# Patient Record
Sex: Male | Born: 1995 | Race: White | Hispanic: No | Marital: Single | Smoking: Never smoker
Health system: Southern US, Community
[De-identification: ages and names within clinical notes are randomized; demographics above are authoritative.]

## PROBLEM LIST (undated history)

## (undated) DIAGNOSIS — F32A Depression, unspecified: Secondary | ICD-10-CM

## (undated) DIAGNOSIS — F329 Major depressive disorder, single episode, unspecified: Secondary | ICD-10-CM

## (undated) DIAGNOSIS — F988 Other specified behavioral and emotional disorders with onset usually occurring in childhood and adolescence: Secondary | ICD-10-CM

## (undated) DIAGNOSIS — F909 Attention-deficit hyperactivity disorder, unspecified type: Secondary | ICD-10-CM

## (undated) DIAGNOSIS — F419 Anxiety disorder, unspecified: Secondary | ICD-10-CM

## (undated) HISTORY — DX: Depression, unspecified: F32.A

## (undated) HISTORY — DX: Attention-deficit hyperactivity disorder, unspecified type: F90.9

## (undated) HISTORY — DX: Major depressive disorder, single episode, unspecified: F32.9

## (undated) HISTORY — DX: Other specified behavioral and emotional disorders with onset usually occurring in childhood and adolescence: F98.8

## (undated) HISTORY — DX: Anxiety disorder, unspecified: F41.9

---
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
## 2017-02-23 ENCOUNTER — Ambulatory Visit (INDEPENDENT_AMBULATORY_CARE_PROVIDER_SITE_OTHER): Payer: Self-pay

## 2017-02-23 ENCOUNTER — Ambulatory Visit (HOSPITAL_COMMUNITY)
Admission: EM | Admit: 2017-02-23 | Discharge: 2017-02-23 | Disposition: A | Discharge status: Home / Self Care | Payer: Self-pay | Attending: Family Medicine | Admitting: Family Medicine

## 2017-02-23 ENCOUNTER — Encounter (HOSPITAL_COMMUNITY): Payer: Self-pay | Admitting: Emergency Medicine

## 2017-02-23 DIAGNOSIS — R079 Chest pain, unspecified: Secondary | ICD-10-CM

## 2017-02-23 NOTE — ED Provider Notes (Signed)
  [facility]   [id number] 02/23/17 Arrival Time: 1452  ASSESSMENT & PLAN:    1. Chest pain, unspecified type    Remains likely that his symptoms stem from underlying anxiety. Did my best to reassure. CXR without abnormality. His past workups have also been negative.  Reviewed expectations re: course of current medical issues. Questions answered. Outlined signs and symptoms indicating need for more acute intervention. Follow up here or in the Emergency Department if worsening. Patient verbalized understanding. After Visit Summary given.   SUBJECTIVE:  Melvin Andrews is a 21 y.o. male who presents with complaint of recurring "chest pain". Past several months. Has been diagnosed with panic attacks in the past but reports he experiences this chest discomfort even when he doesn't feel acute anxiety. Admits he still deals with anxiety on a regular basis. Overall worried about his health. Describes the recurring chest pain as daily but comes and goes. Mainly feels in upper left chest. Can last minutes to hours. Usually comes on acutely. No n/v reported with episodes. Does report feeling like he cannot breathe well when experiencing the discomfort. No back pain. No injury reported. No skin changes. No specific aggravating or alleviating factors reported. Has been treated with Prozac in the past for anxiety but self-disconti  Does not smoke. No illicit drug use reported.  ROS: As per HPI.   OBJECTIVE:  Vitals:   02/23/17 1513  BP: (!) 109/58  Pulse: 84  Resp: 16  Temp: 98.5 F (36.9 C)  TempSrc: Oral  SpO2: 100%     General appearance: alert, cooperative, appears stated age and no distress; does appear anxious Head: Normocephalic, without obvious abnormality, atraumatic Eyes: conjunctivae/corneas clear. PERRL, EOM's intact. Ears: normal TM's and external ear canals both ears Nose: Nares normal. Mucosa normal. No drainage or sinus tenderness. Throat: lips, mucosa, and  tongue normal; teeth and gums normal Neck: no adenopathy and supple, symmetrical, trachea midline Back: symmetric, no curvature. ROM normal. No CVA tenderness. Lungs: clear to auscultation bilaterally Chest wall: no tenderness Heart: regular rate and rhythm Abdomen: soft, non-tender; bowel sounds normal; no masses,  no organomegaly; no guarding or rebound tenderness Extremities: extremities normal, atraumatic, no cyanosis or edema Skin: Skin color, texture, turgor normal. No rashes or lesions Lymph nodes: no lymphadenopathy Neurologic: Alert and oriented X 3, normal strength and tone. Normal symmetric reflexes. Normal gait.  No Known Allergies  PMHx, SurgHx, SocialHx, Medications, and Allergies were reviewed in the Visit Navigator and updated as appropriate.       Mardella LaymanHagler, Analissa Bayless, MD 02/24/17 1001

## 2017-02-23 NOTE — ED Triage Notes (Addendum)
Patient is in the process of getting medicaid.    Patient has a history of chest pain and arm numbness.  Initial episode was 4 months ago and patient refers to event as a panic attack.  Since then says he has not felt normal.  .  Patient reports tightness" in chest and feeling sob, or unable to breathe through nose.  ?patient has had right side of neck pain for a year

## 2017-12-15 ENCOUNTER — Encounter: Payer: Self-pay | Admitting: Family Medicine

## 2017-12-15 ENCOUNTER — Other Ambulatory Visit: Payer: Self-pay

## 2017-12-15 ENCOUNTER — Ambulatory Visit (INDEPENDENT_AMBULATORY_CARE_PROVIDER_SITE_OTHER): Payer: BLUE CROSS/BLUE SHIELD | Admitting: Family Medicine

## 2017-12-15 VITALS — BP 120/60 | HR 131 | Temp 99.6°F | Resp 16 | Ht 70.0 in | Wt 137.4 lb

## 2017-12-15 DIAGNOSIS — J020 Streptococcal pharyngitis: Secondary | ICD-10-CM

## 2017-12-15 DIAGNOSIS — R0789 Other chest pain: Secondary | ICD-10-CM | POA: Diagnosis not present

## 2017-12-15 DIAGNOSIS — Z113 Encounter for screening for infections with a predominantly sexual mode of transmission: Secondary | ICD-10-CM | POA: Diagnosis not present

## 2017-12-15 LAB — POCT RAPID STREP A (OFFICE): Rapid Strep A Screen: NEGATIVE

## 2017-12-15 NOTE — Patient Instructions (Signed)
     IF you received an x-ray today, you will receive an invoice from [facility]. Please contact [facility] at [phone] with questions or concerns regarding your invoice.   IF you received labwork today, you will receive an invoice from [facility]. Please contact [facility] at [phone] with questions or concerns regarding your invoice.   Our billing staff will not be able to assist you with questions regarding bills from these companies.  You will be contacted with the lab results as soon as they are available. The fastest way to get your results is to activate your My Chart account. Instructions are located on the last page of this paperwork. If you have not heard from us regarding the results in 2 weeks, please contact this office.     
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
## 2017-12-16 LAB — CBC
HEMATOCRIT: 43.1 % (ref 37.5–51.0)
Hemoglobin: 14.6 g/dL (ref 13.0–17.7)
MCH: 29.4 pg (ref 26.6–33.0)
MCHC: 33.9 g/dL (ref 31.5–35.7)
MCV: 87 fL (ref 79–97)
Platelets: 314 10*3/uL (ref 150–379)
RBC: 4.96 x10E6/uL (ref 4.14–5.80)
RDW: 12.8 % (ref 12.3–15.4)
WBC: 16.5 10*3/uL — AB (ref 3.4–10.8)

## 2017-12-16 LAB — GC/CHLAMYDIA PROBE AMP
CHLAMYDIA, DNA PROBE: NEGATIVE
NEISSERIA GONORRHOEAE BY PCR: NEGATIVE

## 2017-12-16 LAB — RPR: RPR: NONREACTIVE

## 2017-12-16 LAB — HIV ANTIBODY (ROUTINE TESTING W REFLEX): HIV SCREEN 4TH GENERATION: NONREACTIVE

## 2017-12-16 LAB — HEPATITIS B SURFACE ANTIGEN: Hepatitis B Surface Ag: NEGATIVE

## 2017-12-19 ENCOUNTER — Telehealth: Payer: Self-pay | Admitting: Family Medicine

## 2017-12-19 NOTE — Telephone Encounter (Signed)
Phone call to patient, unable to reach. If patient calls back, please relay the following:  You will be contacted with the lab results as soon as they are available. The fastest way to get your results is to activate your My Chart account. Instructions are located on the last page of this paperwork. If you have not heard from us regarding the results in 2 weeks, please contact this office.

## 2017-12-19 NOTE — Telephone Encounter (Signed)
Copied from CRM [id number]. Topic: Quick Communication - See Telephone Encounter >> Dec 19, 2017 11:44 AM Eston Mouldavis, Naketa Daddario B wrote: CRM for notification. See Telephone encounter for: 12/19/17.  Pt is requesting test results
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
## 2018-01-12 ENCOUNTER — Ambulatory Visit: Payer: BLUE CROSS/BLUE SHIELD | Admitting: Family Medicine

## 2018-01-12 ENCOUNTER — Encounter: Payer: Self-pay | Admitting: Family Medicine

## 2018-01-12 ENCOUNTER — Other Ambulatory Visit: Payer: Self-pay

## 2018-01-12 VITALS — BP 112/52 | HR 104 | Temp 99.0°F | Resp 17 | Ht 70.0 in | Wt 141.2 lb

## 2018-01-12 DIAGNOSIS — J029 Acute pharyngitis, unspecified: Secondary | ICD-10-CM | POA: Diagnosis not present

## 2018-01-12 MED ORDER — CETIRIZINE HCL 10 MG PO TABS: 10.0000 mg | ORAL_TABLET | Freq: Every day | ORAL | 11 refills | Status: AC

## 2018-01-12 NOTE — Patient Instructions (Addendum)
     IF you received an x-ray today, you will receive an invoice from [facility]. Please contact [facility] at [phone] with questions or concerns regarding your invoice.   IF you received labwork today, you will receive an invoice from [facility]. Please contact [facility] at [phone] with questions or concerns regarding your invoice.   Our billing staff will not be able to assist you with questions regarding bills from these companies.  You will be contacted with the lab results as soon as they are available. The fastest way to get your results is to activate your My Chart account. Instructions are located on the last page of this paperwork. If you have not heard from us regarding the results in 2 weeks, please contact this office.     Sore Throat A sore throat is pain, burning, irritation, or scratchiness in the throat. When you have a sore throat, you may feel pain or tenderness in your throat when you swallow or talk. Many things can cause a sore throat, including:  An infection.  Seasonal allergies.  Dryness in the air.  Irritants, such as smoke or pollution.  Gastroesophageal reflux disease (GERD).  A tumor.  A sore throat is often the first sign of another sickness. It may happen with other symptoms, such as coughing, sneezing, fever, and swollen neck glands. Most sore throats go away without medical treatment. Follow these instructions at home:  Take over-the-counter medicines only as told by your health care provider.  Drink enough fluids to keep your urine clear or pale yellow.  Rest as needed.  To help with pain, try: ? Sipping warm liquids, such as broth, herbal tea, or warm water. ? Eating or drinking cold or frozen liquids, such as frozen ice pops. ? Gargling with a salt-water mixture 3-4 times a day or as needed. To make a salt-water mixture, completely dissolve -1 tsp of salt in 1 cup of warm water. ? Sucking on hard candy or throat  lozenges. ? Putting a cool-mist humidifier in your bedroom at night to moisten the air. ? Sitting in the bathroom with the door closed for 5-10 minutes while you run hot water in the shower.  Do not use any tobacco products, such as cigarettes, chewing tobacco, and e-cigarettes. If you need help quitting, ask your health care provider. Contact a health care provider if:  You have a fever for more than 2-3 days.  You have symptoms that last (are persistent) for more than 2-3 days.  Your throat does not get better within 7 days.  You have a fever and your symptoms suddenly get worse. Get help right away if:  You have difficulty breathing.  You cannot swallow fluids, soft foods, or your saliva.  You have increased swelling in your throat or neck.  You have persistent nausea and vomiting. This information is not intended to replace advice given to you by your health care provider. Make sure you discuss any questions you have with your health care provider. Document Released: 09/30/2004 Document Revised: 04/18/2016 Document Reviewed: 06/13/2015 Elsevier Interactive Patient Education  2018 [employer]  

## 2018-01-13 LAB — GC/CHLAMYDIA PROBE AMP
CHLAMYDIA, DNA PROBE: NEGATIVE
Neisseria gonorrhoeae by PCR: NEGATIVE

## 2018-01-14 NOTE — Addendum Note (Signed)
Addended by: Collie Siad A on: 01/14/2018 06:37 AM   Modules accepted: Orders

## 2018-01-14 NOTE — Progress Notes (Signed)
Addendum Pt strep negative, GC/CT negative  Though viral causes are still likely  Will send to ENT  Maybe he needs a biospy

## 2018-03-04 ENCOUNTER — Encounter: Payer: Self-pay | Admitting: Family Medicine

## 2018-03-05 ENCOUNTER — Emergency Department (HOSPITAL_COMMUNITY)
Admission: EM | Admit: 2018-03-05 | Discharge: 2018-03-05 | Disposition: A | Discharge status: Home / Self Care | Payer: BLUE CROSS/BLUE SHIELD | Attending: Emergency Medicine | Admitting: Emergency Medicine

## 2018-03-05 ENCOUNTER — Other Ambulatory Visit: Payer: Self-pay

## 2018-03-05 ENCOUNTER — Encounter (HOSPITAL_COMMUNITY): Payer: Self-pay | Admitting: *Deleted

## 2018-03-05 DIAGNOSIS — Z79899 Other long term (current) drug therapy: Secondary | ICD-10-CM | POA: Diagnosis not present

## 2018-03-05 DIAGNOSIS — F909 Attention-deficit hyperactivity disorder, unspecified type: Secondary | ICD-10-CM | POA: Diagnosis not present

## 2018-03-05 DIAGNOSIS — F419 Anxiety disorder, unspecified: Secondary | ICD-10-CM | POA: Insufficient documentation

## 2018-03-05 DIAGNOSIS — J029 Acute pharyngitis, unspecified: Secondary | ICD-10-CM | POA: Diagnosis present

## 2018-03-05 DIAGNOSIS — F329 Major depressive disorder, single episode, unspecified: Secondary | ICD-10-CM | POA: Insufficient documentation

## 2018-03-05 DIAGNOSIS — B2790 Infectious mononucleosis, unspecified without complication: Secondary | ICD-10-CM

## 2018-03-05 LAB — URINALYSIS, ROUTINE W REFLEX MICROSCOPIC
Bacteria, UA: NONE SEEN
Bilirubin Urine: NEGATIVE
GLUCOSE, UA: NEGATIVE mg/dL
Ketones, ur: 20 mg/dL — AB
LEUKOCYTES UA: NEGATIVE
Nitrite: NEGATIVE
PROTEIN: NEGATIVE mg/dL
Specific Gravity, Urine: 1.005 (ref 1.005–1.030)
pH: 6 (ref 5.0–8.0)

## 2018-03-05 LAB — CBC
HEMATOCRIT: 42.5 % (ref 39.0–52.0)
HEMOGLOBIN: 14 g/dL (ref 13.0–17.0)
MCH: 28.4 pg (ref 26.0–34.0)
MCHC: 32.9 g/dL (ref 30.0–36.0)
MCV: 86.2 fL (ref 78.0–100.0)
PLATELETS: 251 10*3/uL (ref 150–400)
RBC: 4.93 MIL/uL (ref 4.22–5.81)
RDW: 12.7 % (ref 11.5–15.5)
WBC: 14.7 10*3/uL — AB (ref 4.0–10.5)

## 2018-03-05 LAB — COMPREHENSIVE METABOLIC PANEL
ALT: 79 U/L — ABNORMAL HIGH (ref 0–44)
ANION GAP: 11 (ref 5–15)
AST: 48 U/L — ABNORMAL HIGH (ref 15–41)
Albumin: 3.9 g/dL (ref 3.5–5.0)
Alkaline Phosphatase: 137 U/L — ABNORMAL HIGH (ref 38–126)
BUN: 9 mg/dL (ref 6–20)
CO2: 25 mmol/L (ref 22–32)
Calcium: 8.9 mg/dL (ref 8.9–10.3)
Chloride: 96 mmol/L — ABNORMAL LOW (ref 98–111)
Creatinine, Ser: 1.14 mg/dL (ref 0.61–1.24)
Glucose, Bld: 87 mg/dL (ref 70–99)
POTASSIUM: 3.5 mmol/L (ref 3.5–5.1)
Sodium: 132 mmol/L — ABNORMAL LOW (ref 135–145)
Total Bilirubin: 1.3 mg/dL — ABNORMAL HIGH (ref 0.3–1.2)
Total Protein: 7.1 g/dL (ref 6.5–8.1)

## 2018-03-05 LAB — GROUP A STREP BY PCR: Group A Strep by PCR: NOT DETECTED

## 2018-03-05 LAB — MONONUCLEOSIS SCREEN: Mono Screen: POSITIVE — AB

## 2018-03-05 LAB — LIPASE, BLOOD: Lipase: 23 U/L (ref 11–51)

## 2018-03-05 LAB — I-STAT CG4 LACTIC ACID, ED: Lactic Acid, Venous: 1.24 mmol/L (ref 0.5–1.9)

## 2018-03-05 MED ORDER — ACETAMINOPHEN 325 MG PO TABS
650.0000 mg | ORAL_TABLET | Freq: Once | ORAL | Status: AC
  Administered 2018-03-05: 650 mg via ORAL
  Filled 2018-03-05: qty 2

## 2018-03-05 MED ORDER — KETOROLAC TROMETHAMINE 30 MG/ML IJ SOLN
30.0000 mg | Freq: Once | INTRAMUSCULAR | Status: AC
  Administered 2018-03-05: 30 mg via INTRAVENOUS

## 2018-03-05 MED ORDER — DEXAMETHASONE SODIUM PHOSPHATE 10 MG/ML IJ SOLN
10.0000 mg | Freq: Once | INTRAMUSCULAR | Status: AC
  Administered 2018-03-05: 10 mg via INTRAVENOUS
  Filled 2018-03-05: qty 1

## 2018-03-05 MED ORDER — SODIUM CHLORIDE 0.9 % IV BOLUS
1000.0000 mL | Freq: Once | INTRAVENOUS | Status: AC
  Administered 2018-03-05: 1000 mL via INTRAVENOUS

## 2018-03-05 MED ORDER — IBUPROFEN 600 MG PO TABS: 600.0000 mg | ORAL_TABLET | Freq: Four times a day (QID) | ORAL | 0 refills | Status: DC | PRN

## 2018-03-05 MED ORDER — KETOROLAC TROMETHAMINE 30 MG/ML IJ SOLN
30.0000 mg | Freq: Once | INTRAMUSCULAR | Status: DC
  Filled 2018-03-05: qty 1

## 2018-03-05 NOTE — ED Triage Notes (Signed)
Sore throat for 4 months with a fever for 3-4 days  She has a doctor that the pt feels is not being treated.  Last strep screen was 1-2 months ago

## 2018-03-05 NOTE — ED Provider Notes (Signed)
[facility] EMERGENCY DEPARTMENT Provider Note   CSN: [id number] Arrival date & time: 03/05/18  1657     History   Chief Complaint Chief Complaint  Patient presents with  . Sore Throat  . Fever    HPI Melvin Andrews is a 22 y.o. male.  HPI   Patient is a 22 year old male with a history of ADHD, anxiety, depression presenting for sore throat, fever, generalized malaise.  Patient reports that he has had intermittent fevers and sore throats for the past 3 to 4 months, previously evaluated by primary care and had multiple swabs of the throat taken but his condition acutely worse in the last for 5 days.  Patient reports severe pain that has prevented him from keeping anything down by mouth for the past 24 hours.  Patient reports fever at home for the past 3 days.  Patient reports taking ibuprofen at home without full relief of his symptoms.  Patient reports he has been nauseous without vomiting.  Patient also notes nonproductive cough.  Patient denies any other sick contacts.  Patient does note that he was tested for HIV in early May of 2019, which was negative.  Patient also had negative strep and GC/CT swabs of the throat.  Patient does report one sexual partner within the last 3 months, and performs oral and anal intercourse, and 2 partners within last 3 months.   Past Medical History:  Diagnosis Date  . ADD (attention deficit disorder)   . ADHD (attention deficit hyperactivity disorder)   . Anxiety   . Depression     Patient Active Problem List   Diagnosis Date Noted  . 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 Mood disorder (HCC) 12/19/2014    History reviewed. No pertinent surgical history.      Home Medications    Prior to Admission medications   Medication Sig Start Date End Date Taking? Authorizing Provider  cetirizine (ZYRTEC) 10 MG tablet Take 1 tablet (10 mg total) by mouth daily. 01/12/18   Doristine Bosworth, MD  ibuprofen (ADVIL,MOTRIN) 600 MG tablet Take 1 tablet  (600 mg total) by mouth every 6 (six) hours as needed. 03/05/18   Aviva Kluver B, PA-C  methocarbamol (ROBAXIN) 500 MG tablet Take 1 tablet (500 mg total) by mouth 2 (two) times daily. Patient not taking: Reported on 12/15/2017 08/24/15   Anselm Pancoast, PA-C    Family History No family history on file.  Social History Social History   Tobacco Use  . Smoking status: Never Smoker  . Smokeless tobacco: Current User  Substance Use Topics  . Alcohol use: Yes    Alcohol/week: 0.0 oz    Comment: reports occasional use  . Drug use: Yes    Types: Marijuana    Comment: has tried marijuana; denies Rx abuse     Allergies   Oxycodone   Review of Systems Review of Systems  Constitutional: Positive for chills and fever.  HENT: Positive for congestion, rhinorrhea, sore throat and trouble swallowing.   Respiratory: Positive for cough. Negative for wheezing.   Cardiovascular: Negative for chest pain.  Gastrointestinal: Positive for nausea. Negative for abdominal distention and vomiting.  Skin: Negative for rash.  Neurological: Positive for weakness.       +generalized weakness  All other systems reviewed and are negative.    Physical Exam Updated Vital Signs BP 113/70   Pulse 92   Temp 98.6 F (37 C) (Oral)   Resp 18   Ht 5\' 11"  (1.803  m)   Wt 63.5 kg (140 lb)   SpO2 97%   BMI 19.53 kg/m   Physical Exam  Constitutional: He appears well-developed and well-nourished. No distress.  HENT:  Head: Normocephalic and atraumatic.  Mouth/Throat: Uvula is midline and oropharynx is clear and moist. Tonsils are 3+ on the right. Tonsils are 3+ on the left.  Normal phonation. No muffled voice sounds. Patient swallows secretions without difficulty. Dentition normal. No lesions of tongue or buccal mucosa. Uvula midline. No asymmetric swelling of the posterior pharynx. Significant erythema of posterior pharynx. No tonsillar exuduate. No lingual swelling. No induration inferior to tongue. No  submandibular tenderness, swelling, or induration.  Tissues of the neck supple. Posterior cervical lymphadenopathy.  Eyes: Pupils are equal, round, and reactive to light. Conjunctivae and EOM are normal.  Neck: Normal range of motion. Neck supple.  +Posterior cervical adenopathy.  No nuchal rigidity.  No meningismus.  Cardiovascular: Normal rate, regular rhythm, S1 normal and S2 normal.  No murmur heard. Pulmonary/Chest: Effort normal and breath sounds normal. He has no wheezes. He has no rales.  Abdominal: Soft. He exhibits no distension. There is no tenderness. There is no guarding.  Musculoskeletal: Normal range of motion. He exhibits no edema or deformity.  Lymphadenopathy:    He has cervical adenopathy.  Neurological: He is alert.  Cranial nerves grossly intact. Patient moves extremities symmetrically and with good coordination.  Skin: Skin is warm and dry. No rash noted. No erythema.  No rashes of the chest, back, or extremities.  No lesions of the palms or soles.  Psychiatric: He has a normal mood and affect. His behavior is normal. Judgment and thought content normal.  Nursing note and vitals reviewed.  ED Treatments / Results  Labs (all labs ordered are listed, but only abnormal results are displayed) Labs Reviewed  COMPREHENSIVE METABOLIC PANEL - Abnormal; Notable for the following components:      Result Value   Sodium 132 (*)    Chloride 96 (*)    AST 48 (*)    ALT 79 (*)    Alkaline Phosphatase 137 (*)    Total Bilirubin 1.3 (*)    All other components within normal limits  CBC - Abnormal; Notable for the following components:   WBC 14.7 (*)    All other components within normal limits  URINALYSIS, ROUTINE W REFLEX MICROSCOPIC - Abnormal; Notable for the following components:   Hgb urine dipstick MODERATE (*)    Ketones, ur 20 (*)    All other components within normal limits  MONONUCLEOSIS SCREEN - Abnormal; Notable for the following components:   Mono Screen  POSITIVE (*)    All other components within normal limits  GROUP A STREP BY PCR  HSV CULTURE AND TYPING  LIPASE, BLOOD  I-STAT CG4 LACTIC ACID, ED  GC/CHLAMYDIA PROBE AMP ([place]) NOT AT [facility]    EKG None  Radiology No results found.  Procedures Procedures (including critical care time)  Medications Ordered in ED Medications  acetaminophen (TYLENOL) tablet 650 mg (650 mg Oral Given 03/05/18 1720)  sodium chloride 0.9 % bolus 1,000 mL (0 mLs Intravenous Stopped 03/05/18 2031)  dexamethasone (DECADRON) injection 10 mg (10 mg Intravenous Given 03/05/18 1931)  ketorolac (TORADOL) 30 MG/ML injection 30 mg (30 mg Intravenous Given 03/05/18 1939)     Initial Impression / Assessment and Plan / ED Course  I have reviewed the triage vital signs and the nursing notes.  Pertinent labs & imaging results that were available  during my care of the patient were reviewed by me and considered in my medical decision making (see chart for details).     Patient is nontoxic-appearing, febrile up to 101.7, but tolerating secretions and in no acute distress.  Patient exhibits transaminitis and elevated alkaline Foss and total bili.  Suspect that patient may have false positive hemoglobinuria in setting of acute illness, but will have this reassessed.  Leukocytosis noted to 14.7.  Mononucleosis screen is positive.  No evidence of peritonsillar abscess or deep space infection of the head and neck.  Patient deferred further testing for HIV today, however I feel that it may be prudent given that patient has had symptoms of recurrent intermittent fevers for a couple months prior to this acute illness.  Discussed RNA testing.  Patient was given precautions for preventing spread of infectious mononucleosis, as well as avoiding contact activity to prevent splenic rupture for the next 3 weeks.  Ibuprofen and Tylenol advised.  Patient was given return cautions for any difficulty breathing, difficulty swallowing,  trouble with nausea or vomiting, significant neck stiffness, pain, or severe headache.  Patient is in understanding and agrees with the plan of care.  Final Clinical Impressions(s) / ED Diagnoses   Final diagnoses:  Infectious mononucleosis without complication, infectious mononucleosis due to unspecified organism    ED Discharge Orders        Ordered    ibuprofen (ADVIL,MOTRIN) 600 MG tablet  Every 6 hours PRN     03/05/18 2238       Elisha PonderMurray, Zykera Abella B, PA-C 03/05/18 2324    Bethann BerkshireZammit, Joseph, MD 03/09/18 1011

## 2018-03-05 NOTE — Discharge Instructions (Signed)
Please see the information and instructions below regarding your visit.  Your diagnoses today include:  1. Infectious mononucleosis without complication, infectious mononucleosis due to unspecified organism     Tests performed today include: See side panel of your discharge paperwork for testing performed today. Vital signs are listed at the bottom of these instructions.   You are diagnosed with infectious mononucleosis.  This is a condition that can be caused by Epstein-Barr virus.  It has a long incubation.  As well as a long course compared to other viral syndromes.  You are actively infectious right now, and are as long as you have symptoms, however there is no data of the exact time of which she will not be infectious any longer.  Please handle your secretions by covering your mouth, avoiding sharing food, drinks, or saliva with others while you are having symptoms.  I would like you to have your liver enzymes rechecked in 2 to 3 weeks.  At the same time, I would like you to have a repeat test of your HIV PCR and HIV RNA.  I will send a note to your primary doctor regarding this.  Medications prescribed:    Take any prescribed medications only as prescribed, and any over the counter medications only as directed on the packaging.  You are prescribed ibuprofen, a non-steroidal anti-inflammatory agent (NSAID) for pain. You may take 600mg  every 6 hours as needed for pain. If still requiring this medication around the clock for acute pain after 10 days, please see your primary healthcare provider.  Women who are pregnant, breastfeeding, or planning on becoming pregnant should not take non-steroidal anti-inflammatories such as Advil and Aleve. Tylenol is a safe over the counter pain reliever in pregnant women.  You may combine this medication with Tylenol, 650 mg every 6 hours, so you are receiving something for pain every 3 hours.  This is not a long-term medication unless under the care and  direction of your primary provider. Taking this medication long-term and not under the supervision of a healthcare provider could increase the risk of stomach ulcers, kidney problems, and cardiovascular problems such as high blood pressure.    Home care instructions:  Please follow any educational materials contained in this packet.  It is very important that you avoid any kind of activity for you could be hit in the abdomen or have any, contact to the abdomen for the next 3 weeks.  Infectious mononucleosis can cause a splint as well putting her high risk of a spleen rupture.  This is very rare, but please make sure that you avoid any activity that could precipitate this.  Follow-up instructions: Please follow-up with your primary care provider in 2-3 weeks for further evaluation of your symptoms if they are not completely improved.   Return instructions:  Please return to the Emergency Department if you experience worsening symptoms.  Please return the emergency department for any worsening sore throat that prevents you from swallowing or if you have difficulty breathing, any abdominal pain, nausea, vomiting prevention keep anything down, neck stiffness severe neck pain, or severe headache. Please return if you have any other emergent concerns.  Additional Information:   Your vital signs today were: BP 113/70    Pulse 92    Temp 98.6 F (37 C) (Oral)    Resp 18    Ht 5\' 11"  (1.803 m)    Wt 63.5 kg (140 lb)    SpO2 97%    BMI 19.53 kg/m  If your blood pressure (BP) was elevated on multiple readings during this visit above 130 for the top number or above 80 for the bottom number, please have this repeated by your primary care provider within one month. --------------  Thank you for allowing us to participate in your care today.

## 2018-03-08 LAB — HSV CULTURE AND TYPING

## 2018-03-13 ENCOUNTER — Encounter: Payer: Self-pay | Admitting: Family Medicine

## 2018-03-14 NOTE — Telephone Encounter (Signed)
Patient called and states he has not received the work Physicist, medicalletter.  He states you can fax it. Please see below message for the dates. FAX# [phone].

## 2018-08-23 ENCOUNTER — Ambulatory Visit: Payer: BLUE CROSS/BLUE SHIELD | Admitting: Family Medicine

## 2018-08-27 ENCOUNTER — Encounter: Payer: Self-pay | Admitting: Family Medicine

## 2018-09-19 ENCOUNTER — Ambulatory Visit: Payer: Self-pay | Admitting: Family Medicine

## 2018-10-12 ENCOUNTER — Other Ambulatory Visit: Payer: Self-pay

## 2018-10-12 ENCOUNTER — Encounter (HOSPITAL_COMMUNITY): Payer: Self-pay

## 2018-10-12 ENCOUNTER — Emergency Department (HOSPITAL_COMMUNITY)
Admission: EM | Admit: 2018-10-12 | Discharge: 2018-10-12 | Disposition: A | Discharge status: Home / Self Care | Payer: 59 | Attending: Emergency Medicine | Admitting: Emergency Medicine

## 2018-10-12 ENCOUNTER — Emergency Department (HOSPITAL_COMMUNITY): Payer: 59

## 2018-10-12 ENCOUNTER — Ambulatory Visit: Payer: Self-pay | Admitting: *Deleted

## 2018-10-12 DIAGNOSIS — F419 Anxiety disorder, unspecified: Secondary | ICD-10-CM | POA: Diagnosis not present

## 2018-10-12 DIAGNOSIS — Z79899 Other long term (current) drug therapy: Secondary | ICD-10-CM | POA: Insufficient documentation

## 2018-10-12 DIAGNOSIS — M549 Dorsalgia, unspecified: Secondary | ICD-10-CM | POA: Insufficient documentation

## 2018-10-12 DIAGNOSIS — R0602 Shortness of breath: Secondary | ICD-10-CM | POA: Insufficient documentation

## 2018-10-12 DIAGNOSIS — R079 Chest pain, unspecified: Secondary | ICD-10-CM | POA: Diagnosis present

## 2018-10-12 DIAGNOSIS — F17220 Nicotine dependence, chewing tobacco, uncomplicated: Secondary | ICD-10-CM | POA: Diagnosis not present

## 2018-10-12 DIAGNOSIS — G8929 Other chronic pain: Secondary | ICD-10-CM | POA: Diagnosis not present

## 2018-10-12 DIAGNOSIS — R42 Dizziness and giddiness: Secondary | ICD-10-CM | POA: Diagnosis not present

## 2018-10-12 DIAGNOSIS — R5381 Other malaise: Secondary | ICD-10-CM | POA: Diagnosis not present

## 2018-10-12 LAB — BASIC METABOLIC PANEL
Anion gap: 9 (ref 5–15)
BUN: 11 mg/dL (ref 6–20)
CHLORIDE: 104 mmol/L (ref 98–111)
CO2: 26 mmol/L (ref 22–32)
CREATININE: 1.15 mg/dL (ref 0.61–1.24)
Calcium: 9.4 mg/dL (ref 8.9–10.3)
GFR calc non Af Amer: >60 mL/min (ref >60)
Glucose, Bld: 142 mg/dL — ABNORMAL HIGH (ref 70–99)
Potassium: 3.5 mmol/L (ref 3.5–5.1)
SODIUM: 139 mmol/L (ref 135–145)

## 2018-10-12 LAB — CBC WITH DIFFERENTIAL/PLATELET
ABS IMMATURE GRANULOCYTES: 0.02 10*3/uL (ref 0.00–0.07)
Basophils Absolute: 0 10*3/uL (ref 0.0–0.1)
Basophils Relative: 1 %
Eosinophils Absolute: 0.2 10*3/uL (ref 0.0–0.5)
Eosinophils Relative: 3 %
HCT: 43.7 % (ref 39.0–52.0)
HEMOGLOBIN: 14.7 g/dL (ref 13.0–17.0)
IMMATURE GRANULOCYTES: 0 %
Lymphocytes Relative: 30 %
Lymphs Abs: 2.2 10*3/uL (ref 0.7–4.0)
MCH: 29.3 pg (ref 26.0–34.0)
MCHC: 33.6 g/dL (ref 30.0–36.0)
MCV: 87.2 fL (ref 80.0–100.0)
Monocytes Absolute: 0.6 10*3/uL (ref 0.1–1.0)
Monocytes Relative: 8 %
NRBC: 0 % (ref 0.0–0.2)
Neutro Abs: 4.3 10*3/uL (ref 1.7–7.7)
Neutrophils Relative %: 58 %
Platelets: 323 10*3/uL (ref 150–400)
RBC: 5.01 MIL/uL (ref 4.22–5.81)
RDW: 12.7 % (ref 11.5–15.5)
WBC: 7.3 10*3/uL (ref 4.0–10.5)

## 2018-10-12 LAB — TROPONIN I: Troponin I: <0.03 ng/mL (ref <0.03)

## 2018-10-12 NOTE — ED Triage Notes (Signed)
Dizziness, chest pains, and shortness of breath episodes over past month. Noticeably worse recently. States he has been worked up for something similar and was told it was anxiety, but states "this feels different". A/o x 4 on exam, not in distress at this time.

## 2018-10-12 NOTE — Telephone Encounter (Signed)
Pt calling with complaints of left sided chest pain. "I've had chest issues for a long time but everybody is telling me it is anxiety". Pt states he is also having issues with trying to breath and left arm feels a little tingly. Pt concerned because he has a  family history of heart issues. Pt states that he does have left sided chest pain that is sharp at times "during the process of anxiety attacks".  "I have dealt with this for a long time as part of anxiety". Today pt states he feels very dizzy and has been dizzy for the past couple of weeks due to not feeling like he can breath properly. Pt states he has not felt normal in the past 2- 3 months. Pt states he has social anxiety but the way he feels now is different from his normal anxiety. Pt advised to seek treatment in the ED for current symptoms. Pt verbalized understanding.   Reason for Disposition . Dizziness or lightheadedness  Answer Assessment - Initial Assessment Questions 1. LOCATION: "Where does it hurt?"       Left side of chest 2. RADIATION: "Does the pain go anywhere else?" (e.g., into neck, jaw, arms, back)     In left arm tingling and sometimes in right arm 3. ONSET: "When did the chest pain begin?" (Minutes, hours or days)      Pt sates that today it has been constant 4. PATTERN "Does the pain come and go, or has it been constant since it started?"  "Does it get worse with exertion?"      constant 5. CARDIAC RISK FACTORS: "Do you have any history of heart problems or risk factors for heart disease?" (e.g., prior heart attack, angina; high blood pressure, diabetes, being overweight, high cholesterol, smoking, or strong family history of heart disease)     Family history no personal history, pt states that he has been vaping for the past 2 years      6. CAUSE: "What do you think is causing the chest pain?"     unsure 7. OTHER SYMPTOMS: "Do you have any other symptoms?" (e.g., dizziness, nausea, vomiting, sweating, fever,  difficulty breathing, cough)       Dizziness and shortness of breath  Protocols used: CHEST PAIN-A-AH

## 2018-10-12 NOTE — ED Provider Notes (Signed)
[facility] Provider Note   CSN: [id number] Arrival date & time: 10/12/18  1724     History   Chief Complaint Chief Complaint  Patient presents with  . Dizziness  . Shortness of Breath  . Chest Pain    HPI Melvin Andrews is a 23 y.o. male.  HPI   He presents for evaluation of chest pain, shortness of breath, dizziness, and back pain.  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  He denies cough, fever, nausea, vomiting, anorexia, weight loss, focal weakness or paresthesia.  He states that his back pain prevents him from sleeping well at night.  He states "I am going through a lot," but will not specify what that means.  He states his PCP has done STD checking, and it turned out okay.  He has a family history of cardiac disease, and is interested in getting evaluated for heart problems.  There are no other known modifying factors.  Past Medical History:  Diagnosis Date  . ADD (attention deficit disorder)   . ADHD (attention deficit hyperactivity disorder)   . Anxiety   . Depression     Patient Active Problem List   Diagnosis Date Noted  . 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 Mood disorder (HCC) 12/19/2014    History reviewed. No pertinent surgical history.      Home Medications    Prior to Admission medications   Medication Sig Start Date End Date Taking? Authorizing Provider  cetirizine (ZYRTEC) 10 MG tablet Take 1 tablet (10 mg total) by mouth daily. 01/12/18   Doristine BosworthStallings, Zoe A, MD  ibuprofen (ADVIL,MOTRIN) 600 MG tablet Take 1 tablet (600 mg total) by mouth every 6 (six) hours as needed. 03/05/18   Aviva KluverMurray, Alyssa B, PA-C  methocarbamol (ROBAXIN) 500 MG tablet Take 1 tablet (500 mg total) by mouth 2 (two) times daily. Patient not taking: Reported on 12/15/2017 08/24/15   Anselm PancoastJoy, Shawn C, PA-C    Family History History reviewed. No pertinent family history.  Social History Social History    Tobacco Use  . Smoking status: Never Smoker  . Smokeless tobacco: Current User  Substance Use Topics  . Alcohol use: Yes    Alcohol/week: 0.0 standard drinks    Comment: reports occasional use  . Drug use: Yes    Types: Marijuana    Comment: has tried marijuana; denies Rx abuse     Allergies   Oxycodone   Review of Systems Review of Systems  All other systems reviewed and are negative.    Physical Exam Updated Vital Signs BP 125/70 (BP Location: Right Arm)   Pulse 80   Temp 98.7 F (37.1 C) (Oral)   Resp 18   Ht 6' (1.829 m)   Wt 61.2 kg   SpO2 100%   BMI 18.31 kg/m   Physical Exam Vitals signs and nursing note reviewed.  Constitutional:      General: He is not in acute distress.    Appearance: He is well-developed and normal weight. He is not ill-appearing or diaphoretic.  HENT:     Head: Normocephalic and atraumatic.     Right Ear: External ear normal.     Left Ear: External ear normal.     Nose: Nose normal.     Mouth/Throat:     Pharynx: No oropharyngeal exudate or posterior oropharyngeal erythema.  Eyes:     Conjunctiva/sclera: Conjunctivae normal.     Pupils: Pupils are  equal, round, and reactive to light.  Neck:     Musculoskeletal: Normal range of motion and neck supple.     Trachea: Phonation normal.  Cardiovascular:     Rate and Rhythm: Normal rate and regular rhythm.     Heart sounds: Normal heart sounds.  Pulmonary:     Effort: Pulmonary effort is normal.     Breath sounds: Normal breath sounds.  Abdominal:     General: There is no distension.     Palpations: Abdomen is soft.     Tenderness: There is no abdominal tenderness.  Musculoskeletal: Normal range of motion.        General: No tenderness or signs of injury.  Skin:    General: Skin is warm and dry.  Neurological:     Mental Status: He is alert and oriented to person, place, and time.     Cranial Nerves: No cranial nerve deficit.     Sensory: No sensory deficit.     Motor:  No abnormal muscle tone.     Coordination: Coordination normal.  Psychiatric:        Behavior: Behavior normal.        Thought Content: Thought content normal.        Judgment: Judgment normal.     Comments: Anxious      ED Treatments / Results  Labs (all labs ordered are listed, but only abnormal results are displayed) Labs Reviewed  BASIC METABOLIC PANEL - Abnormal; Notable for the following components:      Result Value   Glucose, Bld 142 (*)    All other components within normal limits  TROPONIN I  CBC WITH DIFFERENTIAL/PLATELET    EKG EKG Interpretation  Date/Time:  Thursday October 12 2018 17:32:03 EST Ventricular Rate:  80 PR Interval:    QRS Duration: 99 QT Interval:  362 QTC Calculation: 418 R Axis:   80 Text Interpretation:  Sinus rhythm  Borderline short PR interval Probable anteroseptal infarct, old since last tracing no significant change Confirmed by Mancel Bale [phone]) on 10/12/2018 5:45:31 PM   Radiology Dg Chest 2 View  Result Date: 10/12/2018 CLINICAL DATA:  Intermittent chest pain and shortness of breath over the past month. EXAM: CHEST - 2 VIEW COMPARISON:  02/23/2017. FINDINGS: Normal sized heart. Clear lungs. The lungs remain mildly hyperexpanded with mild peribronchial thickening. Normal appearing bones. IMPRESSION: 1. 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 Since the patient is a nonsmoker, this could be due to passive smoke inhalation or asthma. 2. No acute abnormality. Electronically Signed   By: Beckie Salts M.D.   On: 10/12/2018 18:34    Procedures Procedures (including critical care time)  Medications Ordered in ED Medications - No data to display   Initial Impression / Assessment and Plan / ED Course  I have reviewed the triage vital signs and the nursing notes.  Pertinent labs & imaging results that were available during my care of the patient were reviewed by me and considered in my medical decision making (see chart for  details).  Clinical Course as of Oct 12 1932  Thu Oct 12, 2018  1930 Normal except glucose elevated  Basic metabolic panel(!) [EW]  1930 Normal  Troponin I - Once [EW]  1930 Normal  CBC with Differential [EW]  1931 No infiltrate, or CHF, images reviewed by me  DG Chest 2 View [EW]    Clinical Course User Index [EW] Mancel Bale, MD     Patient Vitals for the  past 24 hrs:  BP Temp Temp src Pulse Resp SpO2 Height Weight  10/12/18 1741 - - - - - - 6' (1.829 m) 61.2 kg  10/12/18 1740 125/70 98.7 F (37.1 C) Oral 80 18 100 % - -    7:32 PM Reevaluation with update and discussion. After initial assessment and treatment, an updated evaluation reveals no change in clinical status.  Findings discussed with the patient and all questions were answered. Mancel BaleElliott Deontay Ladnier   Medical Decision Making: Nonspecific chest discomfort and malaise.  Ongoing symptoms with reassuring evaluation.  Patient has nonspecific back complaints also chronic.  No indication for further ED treatment or hospitalization at this time  CRITICAL CARE-no Performed by: Mancel BaleElliott Nikoloz Huy   Nursing Notes Reviewed/ Care Coordinated Applicable Imaging Reviewed Interpretation of Laboratory Data incorporated into ED treatment  The patient appears reasonably screened and/or stabilized for discharge and I doubt any other medical condition or other [facility] requiring further screening, evaluation, or treatment in the ED at this time prior to discharge.  Plan: Home Medications-OTC analgesia if needed; Home Treatments-rest, fluids; return here if the recommended treatment, does not improve the symptoms; Recommended follow up-PCP follow-up for ongoing management and intervention as needed.  Recommend 1 to 2 weeks.   Final Clinical Impressions(s) / ED Diagnoses   Final diagnoses:  Chest pain, unspecified type  Chronic back pain, unspecified back location, unspecified back pain laterality    ED Discharge Orders    None         Mancel BaleWentz, Destina Mantei, MD 10/12/18 [phone]

## 2018-10-12 NOTE — Discharge Instructions (Addendum)
The testing today did not show any serious problems.  Follow-up with your doctor for further testing and treatment as needed.

## 2018-10-13 NOTE — Telephone Encounter (Signed)
FYI to provider

## 2018-10-17 ENCOUNTER — Ambulatory Visit (INDEPENDENT_AMBULATORY_CARE_PROVIDER_SITE_OTHER): Payer: 59 | Admitting: Family Medicine

## 2018-10-17 ENCOUNTER — Encounter: Payer: Self-pay | Admitting: Family Medicine

## 2018-10-17 VITALS — BP 108/71 | HR 97 | Temp 98.0°F | Resp 16 | Ht 70.47 in | Wt 138.0 lb

## 2018-10-17 DIAGNOSIS — R5383 Other fatigue: Secondary | ICD-10-CM

## 2018-10-17 DIAGNOSIS — R42 Dizziness and giddiness: Secondary | ICD-10-CM

## 2018-10-17 DIAGNOSIS — Z113 Encounter for screening for infections with a predominantly sexual mode of transmission: Secondary | ICD-10-CM

## 2018-10-17 DIAGNOSIS — R208 Other disturbances of skin sensation: Secondary | ICD-10-CM | POA: Diagnosis not present

## 2018-10-17 DIAGNOSIS — K602 Anal fissure, unspecified: Secondary | ICD-10-CM

## 2018-10-17 DIAGNOSIS — R002 Palpitations: Secondary | ICD-10-CM
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
## 2018-10-17 NOTE — Patient Instructions (Addendum)
If you have lab work done today you will be contacted with your lab results within the next 2 weeks.  If you have not heard from us then please contact us. The fastest way to get your results is to register for My Chart.   IF you received an x-ray today, you will receive an invoice from [facility]. Please contact [facility] at [phone] with questions or concerns regarding your invoice.   IF you received labwork today, you will receive an invoice from [facility]. Please contact [facility] at [phone] with questions or concerns regarding your invoice.   Our billing staff will not be able to assist you with questions regarding bills from these companies.  You will be contacted with the lab results as soon as they are available. The fastest way to get your results is to activate your My Chart account. Instructions are located on the last page of this paperwork. If you have not heard from us regarding the results in 2 weeks, please contact this office.      Ambulatory Cardiac Monitoring An ambulatory cardiac monitor is a small recording device that is used to detect abnormal heart rhythms (arrhythmias). Most monitors are connected by wires to flat, sticky disks (electrodes) that are then attached to your chest. You may need to wear a monitor if you have had symptoms such as:  Fast heartbeats (palpitations).  Dizziness.  Fainting or light-headedness.  Unexplained weakness.  Shortness of breath. There are several types of monitors. Some common monitors include:  Holter monitor. This records your heart rhythm continuously, usually for 24-48 hours.  Event (episodic) monitor. This monitor has a symptoms button, and when pushed, it will begin recording. You need to activate this monitor to record when you have a heart-related symptom.  Automatic detection monitor. This monitor will begin recording when it detects an abnormal heartbeat. What are the  risks? Generally, these devices are safe to use. However, it is possible that the skin under the electrodes will become irritated. How to prepare for monitoring Your health care provider will prepare your chest for the electrode placement and show you how to use the monitor.  Do not apply lotions to your chest before monitoring.  Follow directions on how to care for the monitor, and how to return the monitor when the testing period is complete. How to use your cardiac monitor  Follow directions about how long to wear the monitor, and if you can take the monitor off in order to shower or bathe. ? Do not let the monitor get wet. ? Do not bathe, swim, or use a hot tub while wearing the monitor.  Keep your skin clean. Do not put body lotion or moisturizer on your chest.  Change the electrodes as told by your health care provider, or any time they stop sticking to your skin. You may need to use medical tape to keep them on.  Try to put the electrodes in slightly different places on your chest to help prevent skin irritation. Follow directions from your health care provider about where to place the electrodes.  Make sure the monitor is safely clipped to your clothing or in a location close to your body as recommended by your health care provider.  If your monitor has a symptoms button, press the button to mark an event as soon as you feel a heart-related symptom, such as: ? Dizziness. ? Weakness. ? Light-headedness. ? Palpitations. ? Thumping or pounding in your chest. ?  Shortness of breath. ? Unexplained weakness.  Keep a diary of your activities, such as walking, doing chores, and taking medicine. It is very important to note what you were doing when you pushed the button to record your symptoms. This will help your health care provider determine what might be contributing to your symptoms.  Send the recorded information as recommended by your health care provider. It may take some time  for your health care provider to process the results.  Change the batteries as told by your health care provider.  Keep electronic devices away from your monitor. These include: ? Tablets. ? MP3 players. ? Cell phones.  While wearing your monitor you should avoid: ? Electric blankets. ? Firefighter. ? Electric toothbrushes. ? Microwave ovens. ? Magnets. ? Metal detectors. Get help right away if:  You have chest pain.  You have shortness of breath or extreme difficulty breathing.  You develop a very fast heartbeat that does not get better.  You develop dizziness that does not go away.  You faint or constantly feel like you are about to faint. Summary  An ambulatory cardiac monitor is a small recording device that is used to detect abnormal heart rhythms (arrhythmias).  Make sure you understand how to send the information from the monitor to your health care provider.  It is important to press the button on the monitor when you have any heart-related symptoms.  Keep a diary of your activities, such as walking, doing chores, and taking medicine. It is very important to note what you were doing when you pushed the button to record your symptoms. This will help your health care provider learn what might be causing your symptoms. This information is not intended to replace advice given to you by your health care provider. Make sure you discuss any questions you have with your health care provider. Document Released: 06/01/2008 Document Revised: 06/08/2017 Document Reviewed: 08/07/2016 Elsevier Interactive Patient Education  2019 [employer]    Hemorrhoids Hemorrhoids are swollen veins in and around the rectum or anus. There are two types of hemorrhoids:  Internal hemorrhoids. These occur in the veins that are just inside the rectum. They may poke through to the outside and become irritated and painful.  External hemorrhoids. These occur in the veins that are outside  the anus and can be felt as a painful swelling or hard lump near the anus. Most hemorrhoids do not cause serious problems, and they can be managed with home treatments such as diet and lifestyle changes. If home treatments do not help the symptoms, procedures can be done to shrink or remove the hemorrhoids. What are the causes? This condition is caused by increased pressure in the anal area. This pressure may result from various things, including:  Constipation.  Straining to have a bowel movement.  Diarrhea.  Pregnancy.  Obesity.  Sitting for long periods of time.  Heavy lifting or other activity that causes you to strain.  Anal sex.  Riding a bike for a long period of time. What are the signs or symptoms? Symptoms of this condition include:  Pain.  Anal itching or irritation.  Rectal bleeding.  Leakage of stool (feces).  Anal swelling.  One or more lumps around the anus. How is this diagnosed? This condition can often be diagnosed through a visual exam. Other exams or tests may also be done, such as:  An exam that involves feeling the rectal area with a gloved hand (digital rectal exam).  An  exam of the anal canal that is done using a small tube (anoscope).  A blood test, if you have lost a significant amount of blood.  A test to look inside the colon using a flexible tube with a camera on the end (sigmoidoscopy or colonoscopy). How is this treated? This condition can usually be treated at home. However, various procedures may be done if dietary changes, lifestyle changes, and other home treatments do not help your symptoms. These procedures can help make the hemorrhoids smaller or remove them completely. Some of these procedures involve surgery, and others do not. Common procedures include:  Rubber band ligation. Rubber bands are placed at the base of the hemorrhoids to cut off their blood supply.  Sclerotherapy. Medicine is injected into the hemorrhoids to  shrink them.  Infrared coagulation. A type of light energy is used to get rid of the hemorrhoids.  Hemorrhoidectomy surgery. The hemorrhoids are surgically removed, and the veins that supply them are tied off.  Stapled hemorrhoidopexy surgery. The surgeon staples the base of the hemorrhoid to the rectal wall. Follow these instructions at home: Eating and drinking   Eat foods that have a lot of fiber in them, such as whole grains, beans, nuts, fruits, and vegetables.  Ask your health care provider about taking products that have added fiber (fiber supplements).  Reduce the amount of fat in your diet. You can do this by eating low-fat dairy products, eating less red meat, and avoiding processed foods.  Drink enough fluid to keep your urine pale yellow. Managing pain and swelling   Take warm sitz baths for 20 minutes, 3-4 times a day to ease pain and discomfort. You may do this in a bathtub or using a portable sitz bath that fits over the toilet.  If directed, apply ice to the affected area. Using ice packs between sitz baths may be helpful. ? Put ice in a plastic bag. ? Place a towel between your skin and the bag. ? Leave the ice on for 20 minutes, 2-3 times a day. General instructions  Take over-the-counter and prescription medicines only as told by your health care provider.  Use medicated creams or suppositories as told.  Get regular exercise. Ask your health care provider how much and what kind of exercise is best for you. In general, you should do moderate exercise for at least 30 minutes on most days of the week (150 minutes each week). This can include activities such as walking, biking, or yoga.  Go to the bathroom when you have the urge to have a bowel movement. Do not wait.  Avoid straining to have bowel movements.  Keep the anal area dry and clean. Use wet toilet paper or moist towelettes after a bowel movement.  Do not sit on the toilet for long periods of time.  This increases blood pooling and pain.  Keep all follow-up visits as told by your health care provider. This is important. Contact a health care provider if you have:  Increasing pain and swelling that are not controlled by treatment or medicine.  Difficulty having a bowel movement, or you are unable to have a bowel movement.  Pain or inflammation outside the area of the hemorrhoids. Get help right away if you have:  Uncontrolled bleeding from your rectum. Summary  Hemorrhoids are swollen veins in and around the rectum or anus.  Most hemorrhoids can be managed with home treatments such as diet and lifestyle changes.  Taking warm sitz baths can help  ease pain and discomfort.  In severe cases, procedures or surgery can be done to shrink or remove the hemorrhoids. This information is not intended to replace advice given to you by your health care provider. Make sure you discuss any questions you have with your health care provider. Document Released: 08/20/2000 Document Revised: 01/12/2018 Document Reviewed: 01/12/2018 Elsevier Interactive Patient Education  2019 [employer].

## 2018-10-18 LAB — HEPATITIS B SURFACE ANTIGEN: HEP B S AG: NEGATIVE

## 2018-10-18 LAB — RPR: RPR Ser Ql: NONREACTIVE

## 2018-10-18 LAB — HIV ANTIBODY (ROUTINE TESTING W REFLEX): HIV Screen 4th Generation wRfx: NONREACTIVE

## 2018-10-19 LAB — GC/CHLAMYDIA PROBE AMP
Chlamydia trachomatis, NAA: NEGATIVE
Neisseria gonorrhoeae by PCR: NEGATIVE

## 2018-11-01 ENCOUNTER — Encounter: Payer: Self-pay | Admitting: Family Medicine

## 2018-11-23 ENCOUNTER — Telehealth: Payer: Self-pay

## 2018-11-23 NOTE — Telephone Encounter (Addendum)
After reviewing patient's chart Dr. Nishan recommends patient's monitor be rescheduled at 2 to 4 weeks from original appointment, due to COVID 19 precautions. [facility] will call patient to reschedule. 

## 2018-11-30 NOTE — Telephone Encounter (Signed)
Patient on schedule 11/30/18 for holter monitor.  We can have monitor shipped directly to home.  Please call [phone] to discuss.

## 2018-12-19 ENCOUNTER — Other Ambulatory Visit: Payer: Self-pay

## 2018-12-19 ENCOUNTER — Encounter: Payer: Managed Care, Other (non HMO) | Admitting: Family Medicine

## 2019-01-31 NOTE — Progress Notes (Signed)
This encounter was created in error - please disregard.

## 2019-09-20 ENCOUNTER — Other Ambulatory Visit: Payer: Self-pay

## 2019-11-13 ENCOUNTER — Other Ambulatory Visit: Payer: Self-pay

## 2019-11-13 ENCOUNTER — Emergency Department (HOSPITAL_COMMUNITY)
Admission: EM | Admit: 2019-11-13 | Discharge: 2019-11-13 | Disposition: A | Discharge status: Home / Self Care | Payer: Self-pay | Attending: Emergency Medicine | Admitting: Emergency Medicine

## 2019-11-13 ENCOUNTER — Encounter (HOSPITAL_COMMUNITY): Payer: Self-pay | Admitting: Emergency Medicine

## 2019-11-13 DIAGNOSIS — F17290 Nicotine dependence, other tobacco product, uncomplicated: Secondary | ICD-10-CM | POA: Insufficient documentation

## 2019-11-13 DIAGNOSIS — J029 Acute pharyngitis, unspecified: Secondary | ICD-10-CM | POA: Insufficient documentation

## 2019-11-13 LAB — MONONUCLEOSIS SCREEN: Mono Screen: NEGATIVE

## 2019-11-13 LAB — GROUP A STREP BY PCR: Group A Strep by PCR: NOT DETECTED

## 2019-11-13 MED ORDER — LIDOCAINE VISCOUS HCL 2 % MT SOLN
15.0000 mL | Freq: Once | OROMUCOSAL | Status: AC
  Administered 2019-11-13: 15 mL via OROMUCOSAL
  Filled 2019-11-13: qty 15

## 2019-11-13 MED ORDER — METHOCARBAMOL 500 MG PO TABS
500.0000 mg | ORAL_TABLET | Freq: Once | ORAL | Status: AC
  Administered 2019-11-13: 500 mg via ORAL
  Filled 2019-11-13: qty 1

## 2019-11-13 MED ORDER — LIDOCAINE VISCOUS HCL 2 % MT SOLN: 15.0000 mL | OROMUCOSAL | 0 refills | Status: DC | PRN

## 2019-11-13 MED ORDER — METHOCARBAMOL 500 MG PO TABS: 500.0000 mg | ORAL_TABLET | Freq: Two times a day (BID) | ORAL | 0 refills | Status: DC

## 2019-11-13 NOTE — ED Triage Notes (Signed)
Pt to ED with c/o sore throat x's 1 month but has gotten a lot worse over past few days   St's very painful to swallow

## 2019-11-13 NOTE — ED Provider Notes (Signed)
[facility]ER[facility] DEPARTMENT Provider Note   CSN: [id number] Arrival date & time: 11/13/19  0033     History Chief Complaint  Patient presents with  . Sore Throat    Melvin Andrews is a 24 y.o. male.  The history is provided by the patient and medical records.  Sore Throat    24 year old male with history of ADD, ADHD, anxiety, depression, presenting to the ED with sore throat.  States he has been having issues over the past year with his throat, mostly due to post-nasal drip.  He states now he feels "immune" to allergy medicine and received no benefit from it.  States of the past month symptoms have been worsening, pain is now excruciating when trying to eat or drink.  He denies any noted fever or chills.  He has not had any chest pain or shortness of breath.  No abdominal pain, nausea, vomiting, or diarrhea.  He denies any noted sick contacts, however he works at [employer] and is around people all day.  History of mono in the past.  Past Medical History:  Diagnosis Date  . ADD (attention deficit disorder)   . ADHD (attention deficit hyperactivity disorder)   . Anxiety   . Depression     Patient Active Problem List   Diagnosis Date Noted  . 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 Mood disorder ([place]) 12/19/2014    History reviewed. No pertinent surgical history.     No family history on file.  Social History   Tobacco Use  . Smoking status: Never Smoker  . Smokeless tobacco: Current User  Substance Use Topics  . Alcohol use: Yes    Alcohol/week: 0.0 standard drinks    Comment: reports occasional use  . Drug use: Yes    Types: Marijuana    Comment: has tried marijuana; denies Rx abuse    Home Medications Prior to Admission medications   Medication Sig Start Date End Date Taking? Authorizing Provider  cetirizine (ZYRTEC) 10 MG tablet Take 1 tablet (10 mg total) by mouth daily. 01/12/18   Forrest Moron, MD  ibuprofen (ADVIL,MOTRIN) 600 MG tablet Take 1  tablet (600 mg total) by mouth every 6 (six) hours as needed. 03/05/18   Langston Masker B, PA-C  methocarbamol (ROBAXIN) 500 MG tablet Take 1 tablet (500 mg total) by mouth 2 (two) times daily. Patient not taking: Reported on 12/15/2017 08/24/15   Lorayne Bender, PA-C    Allergies    Oxycodone  Review of Systems   Review of Systems  HENT: Positive for sore throat.   All other systems reviewed and are negative.   Physical Exam Updated Vital Signs BP 119/62 (BP Location: Right Arm)   Pulse (!) 55   Temp 98.2 F (36.8 C) (Oral)   Resp 16   Ht 5\' 11"  (1.803 m)   Wt 61.2 kg   SpO2 99%   BMI 18.83 kg/m   Physical Exam Vitals and nursing note reviewed.  Constitutional:      Appearance: He is well-developed.  HENT:     Head: Normocephalic and atraumatic.     Mouth/Throat:     Comments: Tonsils overall normal in appearance bilaterally without exudate; uvula midline without evidence of peritonsillar abscess; some erythema of posterior oropharynx, no edema, handling secretions appropriately; no difficulty swallowing or speaking; normal phonation without stridor Eyes:     Conjunctiva/sclera: Conjunctivae normal.     Pupils: Pupils are equal, round, and reactive to light.  Cardiovascular:  Rate and Rhythm: Normal rate and regular rhythm.     Heart sounds: Normal heart sounds.  Pulmonary:     Effort: Pulmonary effort is normal.     Breath sounds: Normal breath sounds.  Abdominal:     General: Bowel sounds are normal.     Palpations: Abdomen is soft.  Musculoskeletal:        General: Normal range of motion.     Cervical back: Normal range of motion.  Lymphadenopathy:     Head:     Right side of head: Tonsillar adenopathy present.  Skin:    General: Skin is warm and dry.  Neurological:     Mental Status: He is alert and oriented to person, place, and time.     ED Results / Procedures / Treatments   Labs (all labs ordered are listed, but only abnormal results are  displayed) Labs Reviewed  GROUP A STREP BY PCR  MONONUCLEOSIS SCREEN    EKG None  Radiology No results found.  Procedures Procedures (including critical care time)  Medications Ordered in ED Medications  lidocaine (XYLOCAINE) 2 % viscous mouth solution 15 mL (15 mLs Mouth/Throat Given 11/13/19 0626)  methocarbamol (ROBAXIN) tablet 500 mg (500 mg Oral Given 11/13/19 9485)    ED Course  I have reviewed the triage vital signs and the nursing notes.  Pertinent labs & imaging results that were available during my care of the patient were reviewed by me and considered in my medical decision making (see chart for details).    MDM Rules/Calculators/A&P  24 year old male presenting to the ED with sore throat.  Reports he has been having issues for the past year or so, worse over the past month.  Denies any fever or chills.  No sick contacts or known Covid exposures.  On exam he is afebrile and nontoxic.  He has some mild erythema of the posterior oropharynx without edema.  No tonsillar edema or exudates.  He is handling secretions well, normal phonation without stridor.  Rapid strep from triage is negative.  Monospot is also negative.  This likely represents viral process.  Does not seem concerning for gonococcal pharyngitis, he also had recent STD testing that was negative.  Given ongoing issues, will refer to ENT for follow-up.  He has recently lost his insurance so we will also refer to wellness clinic for follow-up.  Patient did request refill of his robaxin for his chronic back issues which was given.  He will follow-up as indicated.  Return here for any new/acute changes.   Final Clinical Impression(s) / ED Diagnoses Final diagnoses:  Sore throat    Rx / DC Orders ED Discharge Orders         Ordered    methocarbamol (ROBAXIN) 500 MG tablet  2 times daily     11/13/19 0629    lidocaine (XYLOCAINE) 2 % solution  As needed     11/13/19 0629           Garlon Hatchet, PA-C  11/13/19 4627    Dione Booze, MD 11/13/19 [phone]

## 2020-04-07 IMAGING — CR DG CHEST 2V
2 series · 2 of 2 positions shown · non-contrast
Comparison: 02/23/2017.

CLINICAL DATA: Intermittent chest pain and shortness of breath over
the past month.

EXAM:
CHEST - 2 VIEW

[chest pa]
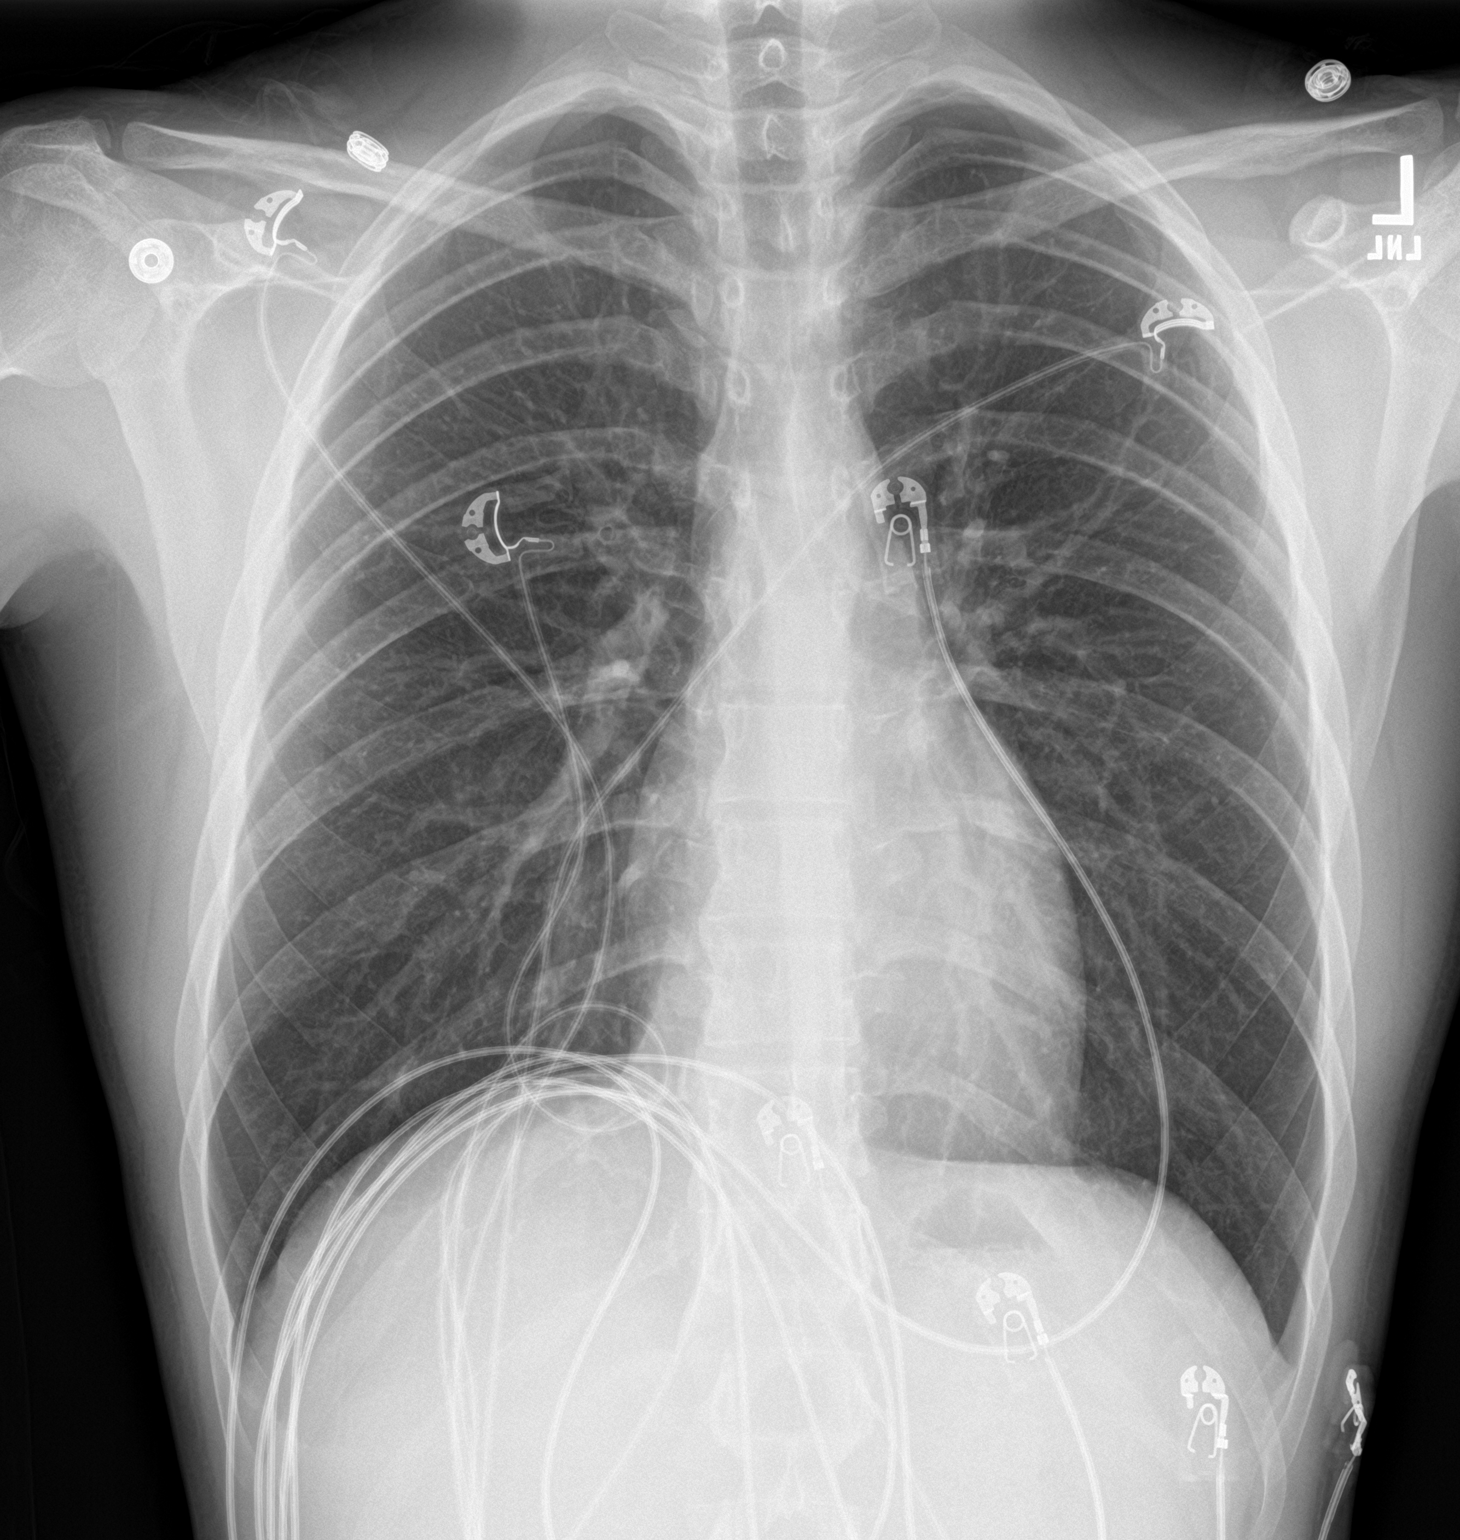

[chest lat]
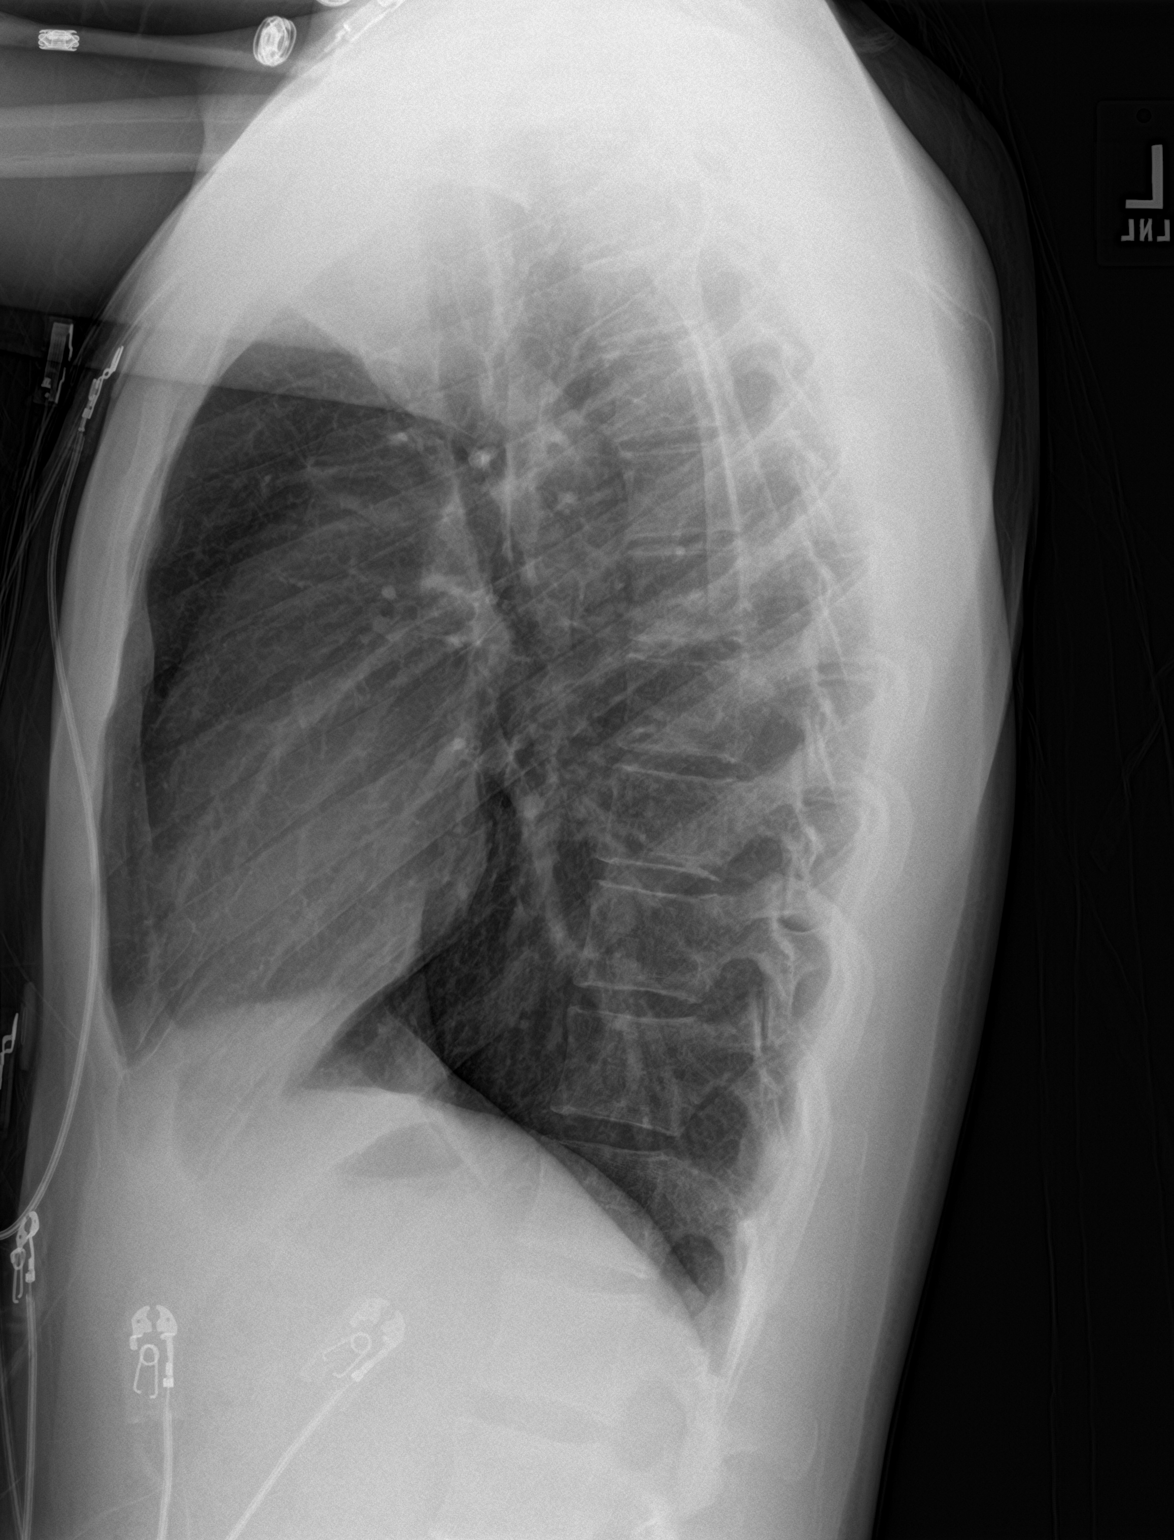

[2 of 2 positions shown; findings below may reference images not displayed]

FINDINGS: Normal sized heart. Clear lungs. The lungs remain mildly
hyperexpanded with mild peribronchial thickening. Normal appearing
bones.
IMPRESSION: 1. Stable mild changes of COPD and chronic bronchitis. Since the
patient is a nonsmoker, this could be due to passive smoke
inhalation or asthma.
2. No acute abnormality.

## 2021-10-05 ENCOUNTER — Ambulatory Visit
Admission: EM | Admit: 2021-10-05 | Discharge: 2021-10-05 | Disposition: A | Discharge status: Home / Self Care | Payer: PRIVATE HEALTH INSURANCE | Attending: Urgent Care | Admitting: Urgent Care

## 2021-10-05 ENCOUNTER — Ambulatory Visit (INDEPENDENT_AMBULATORY_CARE_PROVIDER_SITE_OTHER): Payer: PRIVATE HEALTH INSURANCE

## 2021-10-05 ENCOUNTER — Encounter: Payer: Self-pay | Admitting: Emergency Medicine

## 2021-10-05 ENCOUNTER — Other Ambulatory Visit: Payer: Self-pay

## 2021-10-05 DIAGNOSIS — R079 Chest pain, unspecified: Secondary | ICD-10-CM

## 2021-10-05 DIAGNOSIS — W19XXXA Unspecified fall, initial encounter: Secondary | ICD-10-CM | POA: Diagnosis not present

## 2021-10-05 DIAGNOSIS — S20211A Contusion of right front wall of thorax, initial encounter: Secondary | ICD-10-CM | POA: Diagnosis not present

## 2021-10-05 DIAGNOSIS — R0602 Shortness of breath: Secondary | ICD-10-CM | POA: Diagnosis not present

## 2021-10-05 MED ORDER — NAPROXEN 500 MG PO TABS: 500.0000 mg | ORAL_TABLET | Freq: Two times a day (BID) | ORAL | 0 refills | Status: AC

## 2021-10-05 MED ORDER — TIZANIDINE HCL 4 MG PO TABS: 4.0000 mg | ORAL_TABLET | Freq: Four times a day (QID) | ORAL | 0 refills | Status: AC | PRN

## 2021-10-05 NOTE — Discharge Instructions (Addendum)
Your chest x-ray and rib x-ray are negative for fracture or any issues in the lungs.. Your symptoms are consistent with a chest contusion which is a deep bruise to the musculature. Please apply an ice pack alternatively with a heat pack. Please start taking the anti-inflammatory with food on an as-needed basis. You may start taking the muscle relaxer on an as-needed basis as well.  Medication may make you slightly drowsy.

## 2021-10-05 NOTE — ED Provider Notes (Signed)
[facility]    CSN: [id number] Arrival date & time: 10/05/21  1553      History   Chief Complaint Chief Complaint  Patient presents with   Fall   Shortness of Breath    HPI Melvin Andrews is a 26 y.o. male.   25 year old male presents today with concerns of an injury to his right side of his rib.  He states on Friday of last week he was shopping at [employer] and was goofing around, trying to surprise his friend.  However, he slipped on the floor and ended up falling with a direct blow to his chest.  He states that it hurt after initial fall, but has progressively worsened.  He states his right pectoral muscle is tender, and the pain is reproducible to palpation.  He states it feels tight with deep breaths.  He denies any significant shortness of breath or cough.  He denies any swelling or bruising to his chest.  He denies any prior history of rib injury or fracture.   Fall Associated symptoms include shortness of breath.  Shortness of Breath  Past Medical History:  Diagnosis Date   ADD (attention deficit disorder)    ADHD (attention deficit hyperactivity disorder)    Anxiety    Depression     Patient Active Problem List   Diagnosis Date Noted   Notalgia 12/19/2014   Mood disorder ([facility]) 12/19/2014    History reviewed. No pertinent surgical history.     Home Medications    Prior to Admission medications   Medication Sig Start Date End Date Taking? Authorizing Provider  naproxen (NAPROSYN) 500 MG tablet Take 1 tablet (500 mg total) by mouth 2 (two) times daily with a meal for 7 days. 10/05/21 10/12/21 Yes Zakyla Tonche L, PA  tiZANidine (ZANAFLEX) 4 MG tablet Take 1 tablet (4 mg total) by mouth every 6 (six) hours as needed for up to 7 days for muscle spasms. 10/05/21 10/12/21 Yes Miliano Cotten L, PA  cetirizine (ZYRTEC) 10 MG tablet Take 1 tablet (10 mg total) by mouth daily. 01/12/18   Doristine Bosworth, MD  lidocaine (XYLOCAINE) 2 % solution Use as directed 15  mLs in the mouth or throat as needed for mouth pain. 11/13/19   Garlon Hatchet, PA-C    Family History History reviewed. No pertinent family history.  Social History Social History   Tobacco Use   Smoking status: Never   Smokeless tobacco: Current  Substance Use Topics   Alcohol use: Yes    Alcohol/week: 0.0 standard drinks    Comment: reports occasional use   Drug use: Yes    Types: Marijuana    Comment: has tried marijuana; denies Rx abuse     Allergies   Oxycodone   Review of Systems Review of Systems  Respiratory:  Positive for shortness of breath.   Musculoskeletal:  Positive for myalgias.    Physical Exam Triage Vital Signs ED Triage Vitals  Enc Vitals Group     BP 10/05/21 1722 (!) 113/49     Pulse Rate 10/05/21 1722 (!) 52     Resp 10/05/21 1722 16     Temp 10/05/21 1722 98.6 F (37 C)     Temp Source 10/05/21 1722 Oral     SpO2 10/05/21 1722 98 %     Weight --      Height --      Head Circumference --      Peak Flow --  Pain Score 10/05/21 1723 7     Pain Loc --      Pain Edu? --      Excl. in GC? --    No data found.  Updated Vital Signs BP (!) 113/49 (BP Location: Left Arm)    Pulse (!) 52    Temp 98.6 F (37 C) (Oral)    Resp 16    SpO2 98%   Visual Acuity Right Eye Distance:   Left Eye Distance:   Bilateral Distance:    Right Eye Near:   Left Eye Near:    Bilateral Near:     Physical Exam Vitals and nursing note reviewed.  Constitutional:      General: He is not in acute distress.    Appearance: He is well-developed and normal weight. He is not ill-appearing, toxic-appearing or diaphoretic.     Interventions: He is not intubated. HENT:     Head: Normocephalic and atraumatic.  Cardiovascular:     Rate and Rhythm: Normal rate and regular rhythm. No extrasystoles are present.    Pulses: Normal pulses. No decreased pulses.     Heart sounds: Normal heart sounds. No murmur heard.   No friction rub. No gallop.  Pulmonary:      Effort: Pulmonary effort is normal. No tachypnea, bradypnea, accessory muscle usage or respiratory distress. He is not intubated.     Breath sounds: Normal breath sounds. No stridor. No decreased breath sounds, wheezing, rhonchi or rales.  Chest:     Chest wall: Tenderness (To direct palpation of his right pectoral major muscle.  No point tenderness to the ribs.) present. No mass, deformity, crepitus or edema. There is no dullness to percussion.  Musculoskeletal:        General: Normal range of motion.     Right lower leg: No tenderness. No edema.     Left lower leg: No tenderness. No edema.  Skin:    General: Skin is warm.     Capillary Refill: Capillary refill takes less than 2 seconds.     Findings: No ecchymosis or erythema.     Nails: There is no clubbing.  Neurological:     General: No focal deficit present.     Mental Status: He is alert.  Psychiatric:        Mood and Affect: Mood normal.        Behavior: Behavior normal.     UC Treatments / Results  Labs (all labs ordered are listed, but only abnormal results are displayed) Labs Reviewed - No data to display  EKG   Radiology DG Ribs Unilateral W/Chest Right  Result Date: 10/05/2021 CLINICAL DATA:  Larey Seat on chest, right rib injury, pain and shortness of breath EXAM: RIGHT RIBS AND CHEST - 3+ VIEW COMPARISON:  10/12/2018 FINDINGS: Frontal view of the chest as well as frontal and oblique views of the right thoracic cage are obtained. Cardiac silhouette is unremarkable. No airspace disease, effusion, or pneumothorax. There are no acute displaced rib fractures. IMPRESSION: 1. No acute intrathoracic process.  No displaced fracture. Electronically Signed   By: Sharlet Salina M.D.   On: 10/05/2021 18:10    Procedures Procedures (including critical care time)  Medications Ordered in UC Medications - No data to display  Initial Impression / Assessment and Plan / UC Course  I have reviewed the triage vital signs and the nursing  notes.  Pertinent labs & imaging results that were available during my care of the patient were reviewed by  me and considered in my medical decision making (see chart for details).     Chest contusion -x-rays negative for acute fracture.  No intrathoracic issues.  Warm moist compresses to the area, anti-inflammatories and muscle relaxers as needed.  Final Clinical Impressions(s) / UC Diagnoses   Final diagnoses:  Contusion of right chest wall, initial encounter  Fall, initial encounter     Discharge Instructions      Your chest x-ray and rib x-ray are negative for fracture or any issues in the lungs.. Your symptoms are consistent with a chest contusion which is a deep bruise to the musculature. Please apply an ice pack alternatively with a heat pack. Please start taking the anti-inflammatory with food on an as-needed basis. You may start taking the muscle relaxer on an as-needed basis as well.  Medication may make you slightly drowsy.     ED Prescriptions     Medication Sig Dispense Auth. Provider   naproxen (NAPROSYN) 500 MG tablet Take 1 tablet (500 mg total) by mouth 2 (two) times daily with a meal for 7 days. 14 tablet Jahsir Rama L, PA   tiZANidine (ZANAFLEX) 4 MG tablet Take 1 tablet (4 mg total) by mouth every 6 (six) hours as needed for up to 7 days for muscle spasms. 28 tablet Nina Mondor L, [place]      PDMP not reviewed this encounter.   Maretta BeesCrain, Phineas Mcenroe L, [place] 10/05/21 2148

## 2021-10-05 NOTE — ED Triage Notes (Signed)
Fell onto concrete from a standing/turning position on Friday. Says he landed on his anterior right rib cage. "I hit hard enough that I bounced." Was in pain immediately after, but began to experience a gradual shortness of breath and pain with respiration over the last few days.

## 2021-10-13 ENCOUNTER — Other Ambulatory Visit: Payer: Self-pay

## 2021-10-13 ENCOUNTER — Ambulatory Visit (INDEPENDENT_AMBULATORY_CARE_PROVIDER_SITE_OTHER): Payer: PRIVATE HEALTH INSURANCE | Admitting: Nurse Practitioner

## 2021-10-13 ENCOUNTER — Encounter: Payer: Self-pay | Admitting: Nurse Practitioner

## 2021-10-13 ENCOUNTER — Telehealth: Payer: PRIVATE HEALTH INSURANCE | Admitting: Nurse Practitioner

## 2021-10-13 DIAGNOSIS — Z7689 Persons encountering health services in other specified circumstances: Secondary | ICD-10-CM

## 2021-10-13 NOTE — Patient Instructions (Addendum)
Encounter to establish care:  Stay active   Heart healthy diet  Stay well hydrated  Follow up:  Follow up with Dr. Andrey Campanile or Amy for complete physical

## 2021-10-13 NOTE — Progress Notes (Signed)
Virtual Visit via Telephone Note  I connected with Melvin Andrews on 10/13/21 at  2:40 PM EST by telephone and verified that I am speaking with the correct person using two identifiers.  Location: Patient: home Provider: office   I discussed the limitations, risks, security and privacy concerns of performing an evaluation and management service by telephone and the availability of in person appointments. I also discussed with the patient that there may be a patient responsible charge related to this service. The patient expressed understanding and agreed to proceed.   History of Present Illness:  Patient presents today to establish care. Patient has not had a regular PCP and would like to establish care at primary care at [facility] office. Patient states that he is not currently on any medications other than finishing naproxen and tizanidine that was prescribed at a recent ED visit. Patient states that he doesn't have any significant health history. He does have family history of heart problems and hypertension.  Patient would like to come in for a complete physical with complete STD testing in the near future.  He is not currently symptomatic for any type of STD.  Patient has no new issues or concerns today. Denies f/c/s, n/v/d, hemoptysis, PND, chest pain or edema.        Observations/Objective:  Vitals with BMI 10/05/2021 11/13/2019 11/13/2019  Height - - 5\' 11"   Weight - - 135 lbs  BMI - - 18.84  Systolic 113 117 -  Diastolic 49 62 -  Pulse 52 70 -      Assessment and Plan:  Encounter to establish care:  Stay active   Heart healthy diet  Stay well hydrated  Follow up:  Follow up with Dr. or Amy for complete physical    I discussed the assessment and treatment plan with the patient. The patient was provided an opportunity to ask questions and all were answered. The patient agreed with the plan and demonstrated an understanding of the instructions.   The patient was  advised to call back or seek an in-person evaluation if the symptoms worsen or if the condition fails to improve as anticipated.  I provided 23 minutes of non-face-to-face time during this encounter.   Andrey Campanile, NP

## 2021-12-03 ENCOUNTER — Encounter: Payer: PRIVATE HEALTH INSURANCE | Admitting: Family Medicine

## 2022-02-11 ENCOUNTER — Encounter: Payer: PRIVATE HEALTH INSURANCE | Admitting: Family Medicine

## 2022-08-25 ENCOUNTER — Ambulatory Visit (INDEPENDENT_AMBULATORY_CARE_PROVIDER_SITE_OTHER): Payer: PRIVATE HEALTH INSURANCE | Admitting: Family Medicine

## 2022-08-25 VITALS — BP 100/64 | HR 91 | Temp 98.1°F | Resp 16 | Ht 70.75 in | Wt 150.0 lb

## 2022-08-25 DIAGNOSIS — M546 Pain in thoracic spine: Secondary | ICD-10-CM

## 2022-08-25 DIAGNOSIS — G8929 Other chronic pain: Secondary | ICD-10-CM | POA: Diagnosis not present

## 2022-08-25 DIAGNOSIS — F411 Generalized anxiety disorder: Secondary | ICD-10-CM | POA: Diagnosis not present

## 2022-08-25 MED ORDER — PAROXETINE HCL 20 MG PO TABS: 20.0000 mg | ORAL_TABLET | Freq: Every day | ORAL | 0 refills | Status: DC

## 2022-08-25 NOTE — Progress Notes (Unsigned)
Patient is here to established care with provider today. Patient has many health concern they would like to discuss with provider today  Care gaps discuss at appointment today  

## 2022-08-26 ENCOUNTER — Encounter: Payer: Self-pay | Admitting: Family Medicine
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
## 2022-09-21 ENCOUNTER — Other Ambulatory Visit: Payer: Self-pay | Admitting: Family Medicine

## 2022-09-28 ENCOUNTER — Encounter: Payer: PRIVATE HEALTH INSURANCE | Admitting: Family Medicine

## 2022-10-26 ENCOUNTER — Other Ambulatory Visit: Payer: Self-pay | Admitting: Family Medicine
